# Patient Record
Sex: Male | Born: 1995 | Hispanic: No | Marital: Single | Smoking: Current some day smoker
Health system: Southern US, Community
[De-identification: ages and names within clinical notes are randomized; demographics above are authoritative.]

## PROBLEM LIST (undated history)

## (undated) HISTORY — PX: APPENDECTOMY: SHX54

---
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
## 2014-01-02 ENCOUNTER — Emergency Department (INDEPENDENT_AMBULATORY_CARE_PROVIDER_SITE_OTHER)
Admission: EM | Admit: 2014-01-02 | Discharge: 2014-01-02 | Disposition: A | Discharge status: Home / Self Care | Payer: BC Managed Care – PPO | Source: Home / Self Care

## 2014-01-02 DIAGNOSIS — Z23 Encounter for immunization: Secondary | ICD-10-CM

## 2014-01-02 MED ORDER — TETANUS-DIPHTH-ACELL PERTUSSIS 5-2.5-18.5 LF-MCG/0.5 IM SUSP
0.5000 mL | Freq: Once | INTRAMUSCULAR | Status: AC
  Administered 2014-01-02: 0.5 mL via INTRAMUSCULAR

## 2014-01-02 NOTE — ED Notes (Signed)
Pt is here for TDaP vaccine

## 2016-08-31 ENCOUNTER — Encounter: Payer: Self-pay | Admitting: Emergency Medicine

## 2016-08-31 ENCOUNTER — Emergency Department (INDEPENDENT_AMBULATORY_CARE_PROVIDER_SITE_OTHER)
Admission: EM | Admit: 2016-08-31 | Discharge: 2016-08-31 | Disposition: A | Discharge status: Home / Self Care | Payer: BLUE CROSS/BLUE SHIELD | Source: Home / Self Care | Attending: Family Medicine | Admitting: Family Medicine

## 2016-08-31 DIAGNOSIS — B9789 Other viral agents as the cause of diseases classified elsewhere: Secondary | ICD-10-CM

## 2016-08-31 DIAGNOSIS — J028 Acute pharyngitis due to other specified organisms: Secondary | ICD-10-CM

## 2016-08-31 DIAGNOSIS — L255 Unspecified contact dermatitis due to plants, except food: Secondary | ICD-10-CM

## 2016-08-31 DIAGNOSIS — L237 Allergic contact dermatitis due to plants, except food: Secondary | ICD-10-CM | POA: Diagnosis not present

## 2016-08-31 DIAGNOSIS — J029 Acute pharyngitis, unspecified: Secondary | ICD-10-CM

## 2016-08-31 LAB — POCT RAPID STREP A (OFFICE): RAPID STREP A SCREEN: NEGATIVE

## 2016-08-31 MED ORDER — MUPIROCIN CALCIUM 2 % EX CREA: 1.0000 "application " | TOPICAL_CREAM | Freq: Three times a day (TID) | CUTANEOUS | 0 refills | Status: DC

## 2016-08-31 NOTE — Discharge Instructions (Signed)
Continue to apply Calamine lotion to rash until healed.  If increased redness/pain develop, begin mupirocin antibiotic ointment.  If increased cold symptoms develop, try the following: Take plain guaifenesin (1200mg  extended release tabs such as Mucinex) twice daily, with plenty of water, for cough and congestion.  May add Pseudoephedrine (30mg , one or two every 4 to 6 hours) for sinus congestion.  Get adequate rest.   May use Afrin nasal spray (or generic oxymetazoline) each morning for about 5 days and then discontinue.  Also recommend using saline nasal spray several times daily and saline nasal irrigation (AYR is a common brand).  Use Flonase nasal spray each morning after using Afrin nasal spray and saline nasal irrigation. Try warm salt water gargles for sore throat.  Stop all antihistamines for now, and other non-prescription cough/cold preparations. May take Ibuprofen 200mg , 4 tabs every 8 hours with food for sore throat, body aches, etc.   Follow-up with family doctor if not improving about10 days.

## 2016-08-31 NOTE — ED Triage Notes (Signed)
Patient presents to [facility] with a rash and a complaint of sore throat

## 2016-08-31 NOTE — ED Provider Notes (Signed)
[facility]    CSN: [id number] Arrival date & time: 08/31/16  1422     History   Chief Complaint Chief Complaint  Patient presents with  . Rash  . Sore Throat    HPI Jeremy Fleming is a 21 y.o. male.   Patient contacted poison ivy on his left forearm one week ago, and has a persistent pruritic rash on his left arm.  The rash has improved today after applying calamine lotion. During the past 2 days he has developed a sore throat, sweats, myalgias, and fatigue.  Today he developed a productive cough.  He has seasonal rhinitis (springtime).   The history is provided by the patient.    History reviewed. No pertinent past medical history.  There are no active problems to display for this patient.   Past Surgical History:  Procedure Laterality Date  . APPENDECTOMY         Home Medications    Prior to Admission medications   Medication Sig Start Date End Date Taking? Authorizing Provider  mupirocin cream (BACTROBAN) 2 % Apply 1 application topically 3 (three) times daily. (Rx void after 09/08/16) 08/31/16   Lattie Haw, MD    Family History History reviewed. No pertinent family history.  Social History Social History  Substance Use Topics  . Smoking status: Never Smoker  . Smokeless tobacco: Never Used  . Alcohol use Not on file     Allergies   Hemp seed oil [dronabinol]   Review of Systems Review of Systems  + sore throat + cough No pleuritic pain No wheezing + nasal congestion + post-nasal drainage No sinus pain/pressure No itchy/red eyes No earache No hemoptysis No SOB No fever, + chills/sweats No nausea No vomiting No abdominal pain No diarrhea No urinary symptoms + rash + fatigue + myalgias + headache Used OTC meds without relief    Physical Exam Triage Vital Signs ED Triage Vitals [08/31/16 1513]  Enc Vitals Group     BP (!) 113/55     Pulse Rate 75     Resp      Temp 98.5 F (36.9 C)     Temp Source Oral     SpO2 99 %     Weight 180 lb (81.6 kg)     Height 5\' 7"  (1.702 m)     Head Circumference      Peak Flow      Pain Score 6     Pain Loc      Pain Edu?      Excl. in GC?    No data found.   Updated Vital Signs BP (!) 113/55 (BP Location: Left Arm)   Pulse 75   Temp 98.5 F (36.9 C) (Oral)   Ht 5\' 7"  (1.702 m)   Wt 180 lb (81.6 kg)   SpO2 99%   BMI 28.19 kg/m   Visual Acuity Right Eye Distance:   Left Eye Distance:   Bilateral Distance:    Right Eye Near:   Left Eye Near:    Bilateral Near:     Physical Exam  Constitutional: He appears well-developed and well-nourished. No distress.  HENT:  Head: Normocephalic.  Right Ear: Tympanic membrane, external ear and ear canal normal.  Left Ear: Tympanic membrane, external ear and ear canal normal.  Nose: Nose normal.  Mouth/Throat: Oropharynx is clear and moist.  Eyes: Pupils are equal, round, and reactive to light. Conjunctivae and EOM are normal.  Neck: Neck supple.  Enlarged posterior/lateral  nodes, tender on the left.  Cardiovascular: Normal heart sounds.   Pulmonary/Chest: Breath sounds normal.  Abdominal: There is no tenderness.  Neurological: He is alert.  Skin: Skin is warm and dry. Rash noted. Rash is vesicular.     Lightly erythematous vesicular eruption on left forearm without tenderness, warmth, or swelling.  Nursing note and vitals reviewed.    UC Treatments / Results  Labs (all labs ordered are listed, but only abnormal results are displayed) Labs Reviewed  STREP A DNA PROBE  POCT RAPID STREP A (OFFICE) negative    EKG  EKG Interpretation None       Radiology No results found.  Procedures Procedures (including critical care time)  Medications Ordered in UC Medications - No data to display   Initial Impression / Assessment and Plan / UC Course  I have reviewed the triage vital signs and the nursing notes.  Pertinent labs & imaging results that were available during my care of the  patient were reviewed by me and considered in my medical decision making (see chart for details).    There is no evidence of bacterial infection today.   Continue to apply Calamine lotion to rash until healed.  If increased redness/pain develop, begin mupirocin antibiotic ointment (given Rx to hold).  If increased cold symptoms develop, try the following: Take plain guaifenesin (1200mg  extended release tabs such as Mucinex) twice daily, with plenty of water, for cough and congestion.  May add Pseudoephedrine (30mg , one or two every 4 to 6 hours) for sinus congestion.  Get adequate rest.   May use Afrin nasal spray (or generic oxymetazoline) each morning for about 5 days and then discontinue.  Also recommend using saline nasal spray several times daily and saline nasal irrigation (AYR is a common brand).  Use Flonase nasal spray each morning after using Afrin nasal spray and saline nasal irrigation. Try warm salt water gargles for sore throat.  Stop all antihistamines for now, and other non-prescription cough/cold preparations. May take Ibuprofen 200mg , 4 tabs every 8 hours with food for sore throat, body aches, etc.   Follow-up with family doctor if not improving about10 days.     Final Clinical Impressions(s) / UC Diagnoses   Final diagnoses:  Rhus dermatitis  Acute viral pharyngitis    New Prescriptions New Prescriptions   MUPIROCIN CREAM (BACTROBAN) 2 %    Apply 1 application topically 3 (three) times daily. (Rx void after 09/08/16)         Lattie Haw, MD 08/31/16 [phone]

## 2016-09-05 NOTE — ED Notes (Signed)
NO THROAT CULTURE SENT

## 2017-09-26 ENCOUNTER — Emergency Department
Admission: EM | Admit: 2017-09-26 | Discharge: 2017-09-26 | Disposition: A | Discharge status: Home / Self Care | Payer: BLUE CROSS/BLUE SHIELD | Source: Home / Self Care | Attending: Emergency Medicine | Admitting: Emergency Medicine

## 2017-09-26 ENCOUNTER — Encounter: Payer: Self-pay | Admitting: Emergency Medicine

## 2017-09-26 DIAGNOSIS — R200 Anesthesia of skin: Secondary | ICD-10-CM

## 2017-09-26 NOTE — ED Provider Notes (Signed)
[facility]    CSN: [id number] Arrival date & time: 09/26/17  1020     History   Chief Complaint Chief Complaint  Patient presents with  . Numbness    HPI Jeremy Fleming is a 22 y.o. male.  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 He does have to lift and carry the shingles as well as o gup and down the ladder. He has developed numbness in the medial portion of his left great toe. There is minimal pain but he is concerned about the numbness.He does wear good work shoes. HPI  History reviewed. No pertinent past medical history.  There are no active problems to display for this patient.   Past Surgical History:  Procedure Laterality Date  . APPENDECTOMY         Home Medications    Prior to Admission medications   Not on File    Family History No family history on file.  Social History Social History   Tobacco Use  . Smoking status: Never Smoker  . Smokeless tobacco: Never Used  Substance Use Topics  . Alcohol use: Not on file  . Drug use: Not on file     Allergies   Hemp seed oil [dronabinol]   Review of Systems Review of Systems  Constitutional: Negative.   Musculoskeletal: Negative.   Neurological: Positive for numbness.     Physical Exam Triage Vital Signs ED Triage Vitals  Enc Vitals Group     BP 09/26/17 1045 (!) 145/83     Pulse Rate 09/26/17 1045 67     Resp --      Temp 09/26/17 1045 98.8 F (37.1 C)     Temp Source 09/26/17 1045 Oral     SpO2 09/26/17 1045 100 %     Weight 09/26/17 1046 191 lb 8 oz (86.9 kg)     Height 09/26/17 1046 5\' 7"  (1.702 m)     Head Circumference --      Peak Flow --      Pain Score 09/26/17 1046 0     Pain Loc --      Pain Edu? --      Excl. in GC? --    No data found.  Updated Vital Signs BP (!) 145/83 (BP Location: Right Arm)   Pulse 67   Temp 98.8 F (37.1 C) (Oral)   Ht 5\' 7"  (1.702 m)   Wt 86.9 kg   SpO2 100%   BMI 29.99 kg/m   Visual Acuity Right Eye  Distance:   Left Eye Distance:   Bilateral Distance:    Right Eye Near:   Left Eye Near:    Bilateral Near:     Physical Exam  Musculoskeletal:  Ankle exam is normal. There is decreased sensation mediation of the left great toe. No swelling noted. Normal capillary refill.     UC Treatments / Results  Labs (all labs ordered are listed, but only abnormal results are displayed) Labs Reviewed - No data to display  EKG None  Radiology No results found.  Procedures Procedures (including critical care time)  Medications Ordered in UC Medications - No data to display  Initial Impression / Assessment and Plan / UC Course  I have reviewed the triage vital signs and the nursing notes.  Pertinent labs & imaging results that were available during my care of the patient were reviewed by me and considered in my medical decision making (see chart for details). Symptoms most consistent  with nerve compression to the digital nerve left great toe. Will treat symptomatically and have them follow-up with or consideration for orthotics.He can take over-the-counter anti-inflammatories.   F   Final Clinical Impressions(s) / UC Diagnoses   Final diagnoses:  Numbness  Numbness of toes     Discharge Instructions     Wear shoes with good support. Take Aleve 1-2 twice a day with food. Get gel cushions to insert in your shoes.make an appointment to see Dr. Karie Schwalbe to consider orthotics.    ED Prescriptions    None     Controlled Substance Prescriptions [place] Controlled Substance Registry consulted? Not Applicable   Collene Gobbleaub, Wilberth Damon A, MD 09/26/17 1606

## 2017-09-26 NOTE — Discharge Instructions (Addendum)
Wear shoes with good support. Take Aleve 1-2 twice a day with food. Get gel cushions to insert in your shoes.make an appointment to see Dr. Karie Schwalbe to consider orthotics.

## 2017-09-26 NOTE — ED Triage Notes (Signed)
Patient c/o left big toe numbness for a couple of days, new job requires patient to stand on his feet all day, no injury, no swelling.

## 2019-01-11 ENCOUNTER — Emergency Department (INDEPENDENT_AMBULATORY_CARE_PROVIDER_SITE_OTHER)
Admission: EM | Admit: 2019-01-11 | Discharge: 2019-01-11 | Disposition: A | Discharge status: Home / Self Care | Payer: BC Managed Care – PPO | Source: Home / Self Care | Attending: Family Medicine | Admitting: Family Medicine

## 2019-01-11 ENCOUNTER — Other Ambulatory Visit: Payer: Self-pay

## 2019-01-11 DIAGNOSIS — U071 COVID-19: Secondary | ICD-10-CM

## 2019-01-11 DIAGNOSIS — Z20828 Contact with and (suspected) exposure to other viral communicable diseases: Secondary | ICD-10-CM | POA: Diagnosis not present

## 2019-01-11 DIAGNOSIS — R519 Headache, unspecified: Secondary | ICD-10-CM

## 2019-01-11 DIAGNOSIS — Z20822 Contact with and (suspected) exposure to covid-19: Secondary | ICD-10-CM

## 2019-01-11 LAB — POC SARS CORONAVIRUS 2 AG -  ED: SARS Coronavirus 2 Ag: POSITIVE — AB

## 2019-01-11 NOTE — ED Triage Notes (Signed)
Mother tested positive yesterday.  Pt yesterday was having fatigue, headache.  Today has a sore throat

## 2019-01-11 NOTE — ED Provider Notes (Signed)
[facility]    CSN: [id number] Arrival date & time: 01/11/19  1232      History   Chief Complaint Chief Complaint  Patient presents with  . Sore Throat  . Fatigue  . Headache    HPI Kelli Egolf is a 23 y.o. male.   Patient developed fatigue and headache yesterday, and sore throat and cough today.  His mother tested positive for COVID19 yesterday.  He feels well otherwise.   He denies chest tightness, shortness of breath, and changes in taste/smell.    The history is provided by the patient.    History reviewed. No pertinent past medical history.  There are no problems to display for this patient.   Past Surgical History:  Procedure Laterality Date  . APPENDECTOMY         Home Medications    Prior to Admission medications   Not on File    Family History History reviewed. No pertinent family history.  Social History Social History   Tobacco Use  . Smoking status: Current Some Day Smoker  . Smokeless tobacco: Never Used  Substance Use Topics  . Alcohol use: Yes    Comment: occ  . Drug use: Not Currently     Allergies   Hemp seed oil [dronabinol]   Review of Systems Review of Systems + sore throat + cough No pleuritic pain No wheezing No nasal congestion No post-nasal drainage No sinus pain/pressure No itchy/red eyes No earache No hemoptysis No SOB No fever/chills No nausea No vomiting No abdominal pain No diarrhea No urinary symptoms No skin rash + fatigue No myalgias + headache   Physical Exam Triage Vital Signs ED Triage Vitals  Enc Vitals Group     BP 01/11/19 1249 (!) 143/91     Pulse Rate 01/11/19 1249 (!) 107     Resp 01/11/19 1249 20     Temp 01/11/19 1249 98.6 F (37 C)     Temp Source 01/11/19 1249 Oral     SpO2 01/11/19 1249 97 %     Weight 01/11/19 1250 185 lb (83.9 kg)     Height 01/11/19 1250 5\' 7"  (1.702 m)     Head Circumference --      Peak Flow --      Pain Score 01/11/19 1249 3   Pain Loc --      Pain Edu? --      Excl. in [place]? --    No data found.  Updated Vital Signs BP (!) 143/91 (BP Location: Right Arm)   Pulse (!) 107   Temp 98.6 F (37 C) (Oral)   Resp 20   Ht 5\' 7"  (1.702 m)   Wt 83.9 kg   SpO2 97%   BMI 28.98 kg/m   Visual Acuity Right Eye Distance:   Left Eye Distance:   Bilateral Distance:    Right Eye Near:   Left Eye Near:    Bilateral Near:     Physical Exam Nursing notes and Vital Signs reviewed. Appearance:  Patient appears stated age, and in no acute distress Eyes:  Pupils are equal, round, and reactive to light and accomodation.  Extraocular movement is intact.  Conjunctivae are not inflamed  Ears:  Canals normal.  Tympanic membranes normal.  Nose:  Mildly congested turbinates.  No sinus tenderness.   Pharynx:  Normal Neck:  Supple.  No adenopathy Lungs:  Clear to auscultation.  Breath sounds are equal.  Moving air well. Heart:  Regular rate and  rhythm without murmurs, rubs, or gallops.  Abdomen:  Nontender without masses or hepatosplenomegaly.  Bowel sounds are present.  No CVA or flank tenderness.  Extremities:  No edema.  Skin:  No rash present.   UC Treatments / Results  Labs (all labs ordered are listed, but only abnormal results are displayed) Labs Reviewed  POC SARS CORONAVIRUS 2 AG -  ED - Abnormal; Notable for the following components:      Result Value   SARS Coronavirus 2 Ag Positive (*)    All other components within normal limits    EKG   Radiology No results found.  Procedures Procedures (including critical care time)  Medications Ordered in UC Medications - No data to display  Initial Impression / Assessment and Plan / UC Course  I have reviewed the triage vital signs and the nursing notes.  Pertinent labs & imaging results that were available during my care of the patient were reviewed by me and considered in my medical decision making (see chart for details).    Benign exam.  Treat  symptomatically for now.   Final Clinical Impressions(s) / UC Diagnoses   Final diagnoses:  Close exposure to COVID-19 virus  Bad headache  COVID-19 virus infection     Discharge Instructions     Take plain guaifenesin (1200mg  extended release tabs such as Mucinex) twice daily, with plenty of water, for cough and congestion.  May add Pseudoephedrine (30mg , one or two every 4 to 6 hours) for sinus congestion.  Get adequate rest.   May use Afrin nasal spray (or generic oxymetazoline) each morning for about 5 days and then discontinue.  Also recommend using saline nasal spray several times daily and saline nasal irrigation (AYR is a common brand).  Use Flonase nasal spray each morning after using Afrin nasal spray and saline nasal irrigation. Try warm salt water gargles for sore throat.  Stop all antihistamines for now, and other non-prescription cough/cold preparations. May take Ibuprofen 200mg , 4 tabs every 8 hours with food for body aches, headache, etc. May take Delsym Cough Suppressant at bedtime for nighttime cough.   Increase Vitamin D3 to 5000 units daily, and Vitamin C 500mg  twice daily.   Your COVID19 test is positive.  You are infected with the novel coronavirus and could give the virus to others.  Please continue isolation at home for at least 10 days since the start of your symptoms.  Once you complete your 10 day quarantine, you may return to normal activities as long as you've not had a fever for over 24 hours (without taking fever reducing medicine) and your symptoms are improving. Please continue good preventive care measures, including:  frequent hand-washing, avoid touching your face, cover coughs/sneezes, stay out of crowds and keep a 6 foot distance from others.  Go to the nearest hospital emergency room if fever/cough/breathlessness are severe or illness seems like a threat to life.     ED Prescriptions    None        , MD 01/14/19 [phone]

## 2019-01-11 NOTE — Discharge Instructions (Addendum)
Take plain guaifenesin (1200mg  extended release tabs such as Mucinex) twice daily, with plenty of water, for cough and congestion.  May add Pseudoephedrine (30mg , one or two every 4 to 6 hours) for sinus congestion.  Get adequate rest.   May use Afrin nasal spray (or generic oxymetazoline) each morning for about 5 days and then discontinue.  Also recommend using saline nasal spray several times daily and saline nasal irrigation (AYR is a common brand).  Use Flonase nasal spray each morning after using Afrin nasal spray and saline nasal irrigation. Try warm salt water gargles for sore throat.  Stop all antihistamines for now, and other non-prescription cough/cold preparations. May take Ibuprofen 200mg , 4 tabs every 8 hours with food for body aches, headache, etc. May take Delsym Cough Suppressant at bedtime for nighttime cough.   Increase Vitamin D3 to 5000 units daily, and Vitamin C 500mg  twice daily.   Your COVID19 test is positive.  You are infected with the novel coronavirus and could give the virus to others.  Please continue isolation at home for at least 10 days since the start of your symptoms.  Once you complete your 10 day quarantine, you may return to normal activities as long as you've not had a fever for over 24 hours (without taking fever reducing medicine) and your symptoms are improving. Please continue good preventive care measures, including:  frequent hand-washing, avoid touching your face, cover coughs/sneezes, stay out of crowds and keep a 6 foot distance from others.  Go to the nearest hospital emergency room if fever/cough/breathlessness are severe or illness seems like a threat to life.

## 2020-04-17 ENCOUNTER — Encounter: Payer: Self-pay | Admitting: Sports Medicine

## 2020-04-17 ENCOUNTER — Other Ambulatory Visit: Payer: Self-pay

## 2020-04-17 ENCOUNTER — Ambulatory Visit (INDEPENDENT_AMBULATORY_CARE_PROVIDER_SITE_OTHER): Payer: BLUE CROSS/BLUE SHIELD | Admitting: Sports Medicine

## 2020-04-17 VITALS — BP 118/79 | HR 72 | Ht 67.0 in | Wt 212.0 lb

## 2020-04-17 DIAGNOSIS — R569 Unspecified convulsions: Secondary | ICD-10-CM | POA: Diagnosis not present

## 2020-04-17 DIAGNOSIS — G40909 Epilepsy, unspecified, not intractable, without status epilepticus: Secondary | ICD-10-CM | POA: Insufficient documentation

## 2020-04-17 DIAGNOSIS — R011 Cardiac murmur, unspecified: Secondary | ICD-10-CM | POA: Diagnosis not present

## 2020-04-17 NOTE — Progress Notes (Signed)
    Procedures performed today:    None.  Independent interpretation of notes and tests performed by another provider:   None.  Brief History, Exam, Impression, and Recommendations:    Nocturnal seizure (HCC) Jeremy Fleming is a pleasant and previously healthy 25 year old male, on 2 occasions in March and early April he woke up after having bitten his tongue, he also had urinary incontinence. He never had any symptoms prior, no constitutional symptoms, no head trauma, no prior infections. 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 He simply woke up having wet the bed and bit his tongue. Certainly the concern is for seizure disorder. Nondilated funduscopy is normal today, his neurologic exam is also normal. He does have a 2/6 systolic murmur, adding an echocardiogram. We are going to get him referred to neurology, he likely needs EEGs particularly considering nocturnal seizure disorder, he was cautioned against driving and swimming, adding a brain MRI as well as some labs including urine drug screen.   Systolic murmur Systolic murmur, potential new onset seizures, adding an echocardiogram.    ___________________________________________ Ihor Austin. Benjamin Stain, M.D., ABFM., CAQSM. Primary Care and Sports Medicine [facility]  Adjunct Instructor of Family Medicine  [employer]

## 2020-04-17 NOTE — Assessment & Plan Note (Signed)
Systolic murmur, potential new onset seizures, adding an echocardiogram.

## 2020-04-17 NOTE — Assessment & Plan Note (Signed)
Jeremy Fleming is a pleasant and previously healthy 25 year old male, on 2 occasions in March and early April he woke up after having bitten his tongue, he also had urinary incontinence. He never had any symptoms prior, no constitutional symptoms, no head trauma, no prior infections. 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 He simply woke up having wet the bed and bit his tongue. Certainly the concern is for seizure disorder. Nondilated funduscopy is normal today, his neurologic exam is also normal. He does have a 2/6 systolic murmur, adding an echocardiogram. We are going to get him referred to neurology, he likely needs EEGs particularly considering nocturnal seizure disorder, he was cautioned against driving and swimming, adding a brain MRI as well as some labs including urine drug screen.

## 2020-04-18 ENCOUNTER — Encounter: Payer: Self-pay | Admitting: Sports Medicine

## 2020-04-18 LAB — COMPREHENSIVE METABOLIC PANEL: Glucose, Bld: 80 mg/dL (ref 65–99)

## 2020-04-20 ENCOUNTER — Telehealth: Payer: Self-pay

## 2020-04-20 NOTE — Telephone Encounter (Signed)
Patient is scheduled for downstairs on 05/07/20.  They tried to schedule him earlier but he was traveling per Mom. Advised Mom to get a disc with the images when they go for the MRI since they want to see a neurologist in [place].

## 2020-04-20 NOTE — Telephone Encounter (Signed)
Mom, Clydie Braun, left a message that [facility] couldn't get them in before 05/17/20 for the MRI. They are concerned about waiting this long. I think they are trying to stay in [facility] for all services and within their insurance network [facility]) which is limiting their options. Please advise.

## 2020-04-20 NOTE — Telephone Encounter (Signed)
I absolutely will let you know.

## 2020-04-20 NOTE — Telephone Encounter (Signed)
I would agree that having to wait a month for this is unreasonable.  The other option is they call around to facilities that are in network and see who has MRI availabilities.  Final option, they may just have to do it here and pay what ever out of network fees are needed, we have openings Monday.

## 2020-04-20 NOTE — Telephone Encounter (Signed)
Please let me know if they change locations because I will have to contact his ins to change that for the authorization.

## 2020-04-22 LAB — COMPREHENSIVE METABOLIC PANEL
AG Ratio: 2.3 (calc) (ref 1.0–2.5)
Total Protein: 7.2 g/dL (ref 6.1–8.1)

## 2020-04-22 NOTE — Telephone Encounter (Signed)
Thank you, PS note location switch for MRI

## 2020-04-24 ENCOUNTER — Telehealth: Payer: Self-pay | Admitting: Sports Medicine

## 2020-04-24 NOTE — Telephone Encounter (Signed)
MRI brain approved for continuity of care and emergency care, authorization #[id number] good 04/23/2020 through 10/19/2020.

## 2020-04-26 LAB — VITAMIN B6: Vitamin B6: 36.3 ng/mL — ABNORMAL HIGH (ref 2.1–21.7)

## 2020-04-26 LAB — COMPREHENSIVE METABOLIC PANEL WITH GFR
AST: 20 U/L (ref 10–40)
Alkaline phosphatase (APISO): 42 U/L (ref 36–130)
BUN: 12 mg/dL (ref 7–25)
CO2: 27 mmol/L (ref 20–32)
Chloride: 100 mmol/L (ref 98–110)
Sodium: 138 mmol/L (ref 135–146)

## 2020-04-26 LAB — LIPID PANEL
Cholesterol: 225 mg/dL — ABNORMAL HIGH (ref <200)
HDL: 45 mg/dL (ref >=40)
LDL Cholesterol (Calc): 135 mg/dL — ABNORMAL HIGH
Non-HDL Cholesterol (Calc): 180 mg/dL (calc) — ABNORMAL HIGH (ref <130)
Total CHOL/HDL Ratio: 5 (calc) — ABNORMAL HIGH (ref <5.0)
Triglycerides: 292 mg/dL — ABNORMAL HIGH (ref <150)

## 2020-04-26 LAB — COMPREHENSIVE METABOLIC PANEL
ALT: 34 U/L (ref 9–46)
Albumin: 5 g/dL (ref 3.6–5.1)
Calcium: 9.9 mg/dL (ref 8.6–10.3)
Creat: 0.77 mg/dL (ref 0.60–1.35)
Globulin: 2.2 g/dL (calc) (ref 1.9–3.7)
Potassium: 3.8 mmol/L (ref 3.5–5.3)
Total Bilirubin: 1.4 mg/dL — ABNORMAL HIGH (ref 0.2–1.2)

## 2020-04-26 LAB — CBC
HCT: 45.6 % (ref 38.5–50.0)
Hemoglobin: 15.6 g/dL (ref 13.2–17.1)
MCH: 30.4 pg (ref 27.0–33.0)
MCHC: 34.2 g/dL (ref 32.0–36.0)
MCV: 88.7 fL (ref 80.0–100.0)
MPV: 9.5 fL (ref 7.5–12.5)
Platelets: 300 Thousand/uL (ref 140–400)
RBC: 5.14 Million/uL (ref 4.20–5.80)
RDW: 11.8 % (ref 11.0–15.0)
WBC: 8.8 Thousand/uL (ref 3.8–10.8)

## 2020-04-26 LAB — DRUG SCREEN, COMPREHENSIVE, WITH CONFIRMATION, URINE: Urine Results: DETECTED — AB

## 2020-04-26 LAB — PROLACTIN: Prolactin: 3.5 ng/mL (ref 2.0–18.0)

## 2020-04-26 LAB — HEMOGLOBIN A1C
Hgb A1c MFr Bld: 4.7 %{Hb} (ref <5.7)
Mean Plasma Glucose: 88 mg/dL
eAG (mmol/L): 4.9 mmol/L

## 2020-04-26 LAB — TSH: TSH: 2.42 m[IU]/L (ref 0.40–4.50)

## 2020-04-30 ENCOUNTER — Other Ambulatory Visit: Payer: BLUE CROSS/BLUE SHIELD

## 2020-05-02 ENCOUNTER — Ambulatory Visit (INDEPENDENT_AMBULATORY_CARE_PROVIDER_SITE_OTHER): Payer: BLUE CROSS/BLUE SHIELD

## 2020-05-02 ENCOUNTER — Other Ambulatory Visit: Payer: Self-pay

## 2020-05-02 ENCOUNTER — Ambulatory Visit: Payer: BLUE CROSS/BLUE SHIELD | Admitting: Sports Medicine

## 2020-05-02 DIAGNOSIS — G40909 Epilepsy, unspecified, not intractable, without status epilepticus: Secondary | ICD-10-CM | POA: Diagnosis not present

## 2020-05-02 DIAGNOSIS — G9389 Other specified disorders of brain: Secondary | ICD-10-CM | POA: Diagnosis not present

## 2020-05-02 MED ORDER — IOHEXOL 300 MG/ML  SOLN
100.0000 mL | Freq: Once | INTRAMUSCULAR | Status: AC | PRN
  Administered 2020-05-02: 75 mL via INTRAVENOUS

## 2020-05-02 MED ORDER — LEVETIRACETAM 500 MG PO TABS: ORAL_TABLET | ORAL | 3 refills | Status: AC

## 2020-05-02 NOTE — Progress Notes (Addendum)
    Procedures performed today:    None.  Independent interpretation of notes and tests performed by another provider:   None.  Brief History, Exam, Impression, and Recommendations:    Seizure disorder (HCC) Jeremy Fleming is a pleasant, previously healthy 25 year old male, he has now had 3 generalized tonic clonic seizures. He has bitten his tongue both times, he was postictal afterwards. The most recent episode was on an airplane. Neurologic exam is normal today, he does have signs of tongue biting. He does have a brain MRI scheduled in 5 days, due to the new seizure and headache we are going to get a head CT with IV contrast today. I did discuss the case as a curbside with one of our neuro hospitalists who agrees with the above plan. As this is his third seizure and he has not yet seen the neurologist I am also going to start levetiracetam loaded 1000 mg p.o. daily x1 and then 500 mg p.o. twice daily. He understands that he will not be able to drive potentially for at least 6 months, and should avoid swimming, and working at heights.   Brain mass CT scan personally reviewed. 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    ___________________________________________ Ihor Austin. Benjamin Stain, M.D., ABFM., CAQSM. Primary Care and Sports Medicine [facility]  Adjunct Instructor of Family Medicine  [employer]

## 2020-05-02 NOTE — Assessment & Plan Note (Addendum)
CT scan personally reviewed. 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

## 2020-05-02 NOTE — Assessment & Plan Note (Addendum)
Jeremy Fleming is a pleasant, previously healthy 25 year old male, he has now had 3 generalized tonic clonic seizures. He has bitten his tongue both times, he was postictal afterwards. The most recent episode was on an airplane. Neurologic exam is normal today, he does have signs of tongue biting. He does have a brain MRI scheduled in 5 days, due to the new seizure and headache we are going to get a head CT with IV contrast today. I did discuss the case as a curbside with one of our neuro hospitalists who agrees with the above plan. As this is his third seizure and he has not yet seen the neurologist I am also going to start levetiracetam loaded 1000 mg p.o. daily x1 and then 500 mg p.o. twice daily. He understands that he will not be able to drive potentially for at least 6 months, and should avoid swimming, and working at heights.

## 2020-05-02 NOTE — Addendum Note (Signed)
Addended by: Monica Becton on: 05/02/2020 05:35 PM   Modules accepted: Orders

## 2020-05-07 ENCOUNTER — Other Ambulatory Visit: Payer: Self-pay

## 2020-05-07 ENCOUNTER — Telehealth (INDEPENDENT_AMBULATORY_CARE_PROVIDER_SITE_OTHER): Payer: BLUE CROSS/BLUE SHIELD | Admitting: Family Medicine

## 2020-05-07 ENCOUNTER — Ambulatory Visit (INDEPENDENT_AMBULATORY_CARE_PROVIDER_SITE_OTHER): Payer: BLUE CROSS/BLUE SHIELD

## 2020-05-07 ENCOUNTER — Encounter: Payer: Self-pay | Admitting: Family Medicine

## 2020-05-07 VITALS — Temp 99.0°F | Wt 210.0 lb

## 2020-05-07 DIAGNOSIS — R509 Fever, unspecified: Secondary | ICD-10-CM

## 2020-05-07 DIAGNOSIS — R9389 Abnormal findings on diagnostic imaging of other specified body structures: Secondary | ICD-10-CM

## 2020-05-07 DIAGNOSIS — R569 Unspecified convulsions: Secondary | ICD-10-CM | POA: Diagnosis not present

## 2020-05-07 MED ORDER — GADOBUTROL 1 MMOL/ML IV SOLN
9.0000 mL | Freq: Once | INTRAVENOUS | Status: AC | PRN
  Administered 2020-05-07: 9 mL via INTRAVENOUS

## 2020-05-07 NOTE — Addendum Note (Signed)
Addended by: Monica Becton on: 05/07/2020 05:46 PM   Modules accepted: Orders

## 2020-05-07 NOTE — Progress Notes (Signed)
Virtual Visit via Telephone Note  I connected with  Jeremy Fleming on 05/07/20 at  2:00 PM EDT by telephone and verified that I am speaking with the correct person using two identifiers.   I discussed the limitations, risks, security and privacy concerns of performing an evaluation and management service by telephone and the availability of in person appointments. I also discussed with the patient that there may be a patient responsible charge related to this service. The patient expressed understanding and agreed to proceed.  Participating parties included in this telephone visit include: The patient and the nurse practitioner listed.  The patient is: At home I am: In the office  Subjective:    CC: fever  HPI: Jeremy Fleming is a 25 y.o. year old male presenting today via telephone visit to discuss fever.  Patient reports last night he had a fever (101 F) with chills, body aches, and intermittent 5/10 headache with mild sore throat and occasional coughing overnight. He was not able to sleep well because of these symptoms, and reports when he did finally fall asleep and wake up this morning he was sweaty as if his fever had broken. He states he has been taking occasional tylenol since symptoms began, but has been feeling mostly normal all day. States he is a little tired, but reports he has been more fatigued since starting keppra last Wednesday.   Has had recent seizures prompting a CT scan which showed a frontal mass and led to an MRI which was done earlier today, but results are still pending. States his last seizure (last Tuesday) he bit his tongue and cheek pretty bad, but they seem to be healing and do not appear infected per patient.   He has not had any chest pain, trouble breathing, ear pain, sinus pain/pressure, dysuria, hematuria, abdominal pain, blood in stool, nausea, vomiting, diarrhea, neck pain/stiffness, vision changes.  Co-worker with brief virus, but was resolved by the time he  saw him - not tested for COVID. Patient is not vaccinated against COVID but he took a home COVID test today which was negative.     Past medical history, Surgical history, Family history not pertinant except as noted below, Social history, Allergies, and medications have been entered into the medical record, reviewed, and corrections made.   Review of Systems:  All review of systems negative except what is listed in the HPI  Objective:    General:  Patient speaking clearly in complete sentences. No shortness of breath noted.   Alert and oriented x3.   Normal judgment.  No apparent acute distress.  Impression and Recommendations:    1. Fever, unspecified fever cause Patient with fever and symptoms as described above last night but now completely resolved. Home COVID test was negative. Given that his symptoms have fully resolved, encouraged close monitoring of return of symptoms. Patient educated on signs and symptoms that would require further evaluation.   Follow-up if symptoms worsen or fail to improve.   I discussed the assessment and treatment plan with the patient. The patient was provided an opportunity to ask questions and all were answered. The patient agreed with the plan and demonstrated an understanding of the instructions.   The patient was advised to call back or seek an in-person evaluation if the symptoms worsen or if the condition fails to improve as anticipated.  I provided 20 minutes of non-face-to-face time during this TELEPHONE encounter.    Clayborne Dana, NP

## 2020-05-08 ENCOUNTER — Ambulatory Visit: Payer: BLUE CROSS/BLUE SHIELD | Admitting: Diagnostic Neuroimaging

## 2020-05-25 ENCOUNTER — Ambulatory Visit (HOSPITAL_COMMUNITY): Payer: BLUE CROSS/BLUE SHIELD

## 2020-05-30 ENCOUNTER — Telehealth: Payer: Self-pay | Admitting: General Practice

## 2020-05-30 NOTE — Telephone Encounter (Signed)
Transition Care Management Follow-up Telephone Call  Date of discharge and from where: [facility] 05/28/20  How have you been since you were released from the hospital? Patient had surgery and is doing better.  Any questions or concerns? No  Items Reviewed:  Did the pt receive and understand the discharge instructions provided? Yes   Medications obtained and verified? Yes   Other? No   Any new allergies since your discharge? No   Dietary orders reviewed? Yes  Do you have support at home? Yes   Home Care and Equipment/Supplies: Were home health services ordered? no  Functional Questionnaire: (I = Independent and D = Dependent) ADLs: I  Bathing/Dressing- I  Meal Prep- I  Eating- I  Maintaining continence- I  Transferring/Ambulation- I  Managing Meds- I  Follow up appointments reviewed:   PCP Hospital f/u appt confirmed? No  Patient referred to specialist at this time.  Specialist Hospital f/u appt confirmed? Yes  Scheduled to see Dr. Tempie Donning on 07/04/20 @ 1230.  Are transportation arrangements needed? No   If their condition worsens, is the pt aware to call PCP or go to the Emergency Dept.? Yes  Was the patient provided with contact information for the PCP's office or ED? Yes  Was to pt encouraged to call back with questions or concerns? Yes

## 2022-05-08 IMAGING — MR MR HEAD WO/W CM
15 series · 41 of 48 positions shown · IV contrast (gadavist)
Comparison: Correlation made with prior CT

CLINICAL DATA: Potential nocturnal seizures, abnormal CT

EXAM:
MRI HEAD WITHOUT AND WITH CONTRAST
TECHNIQUE: Multiplanar, multiecho pulse sequences of the brain and surrounding
structures were obtained without and with intravenous contrast.
CONTRAST:  9mL GADAVIST GADOBUTROL 1 MMOL/ML IV SOLN

[Series 2: DWI · axial · 3.0mm · 1.20mm/px · z∈[-65,+97]mm · 6 of 110 slices shown (1 of 4)]
[im 1/110]
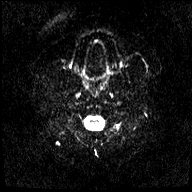
[im 22/110]
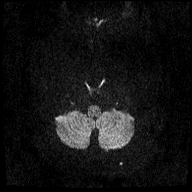
[im 44/110]
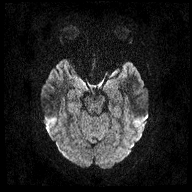
[im 66/110]
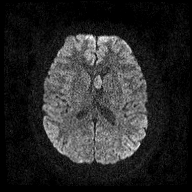
[im 88/110]
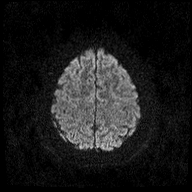
[im 110/110]
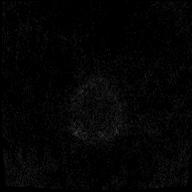

[Series 3: DWI · axial · 3.0mm · 1.20mm/px · z∈[-65,+97]mm · 3 of 55 slices shown (2 of 4)]
[im 1/55]
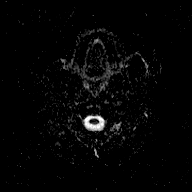
[im 28/55]
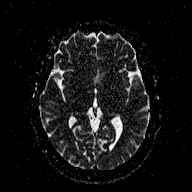
[im 55/55]
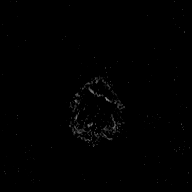

[Series 4: DWI · coronal · 3.0mm · 1.15mm/px · 5 of 89 slices shown (3 of 4)]
[im 1/89]
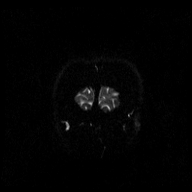
[im 23/89]
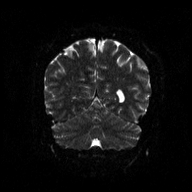
[im 45/89]
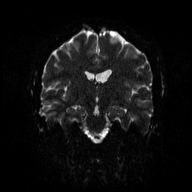
[im 67/89]
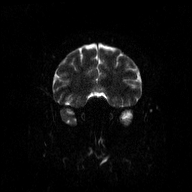
[im 89/89]
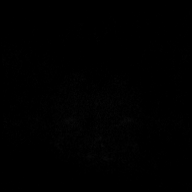

[Series 5: DWI · coronal · 3.0mm · 1.15mm/px · 2 of 45 slices shown (4 of 4)]
[im 1/45]
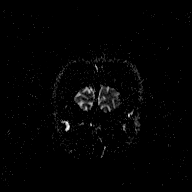
[im 45/45]
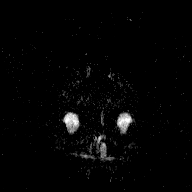

[Series 6: T1 · sagittal · 5.0mm · 0.45mm/px · 1 of 23 slices shown (1 of 2)]
[im 1/23]
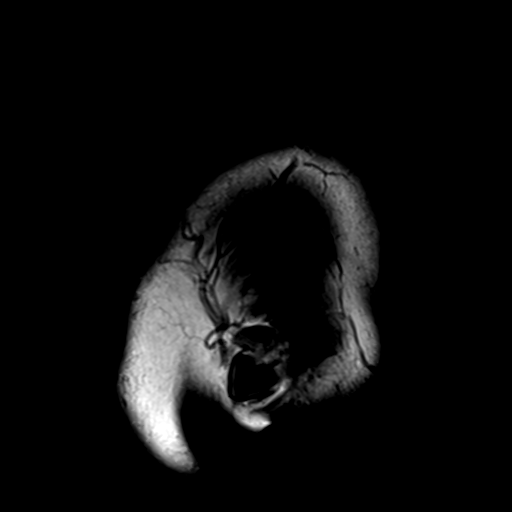

[Series 7: T2 · axial · 5.0mm · 0.72mm/px · 1 of 23 slices shown (1 of 3)]
[im 1/23]
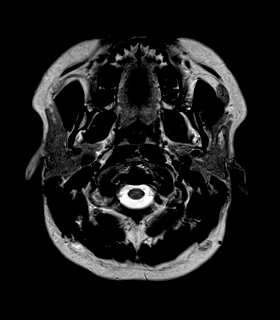

[Series 8: FLAIR · axial · 3.0mm · 0.45mm/px · z∈[-61,+101]mm · 3 of 55 slices shown (1 of 2)]
[im 1/55]
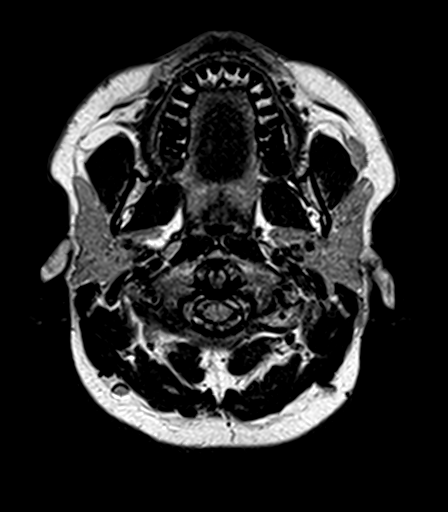
[im 28/55]
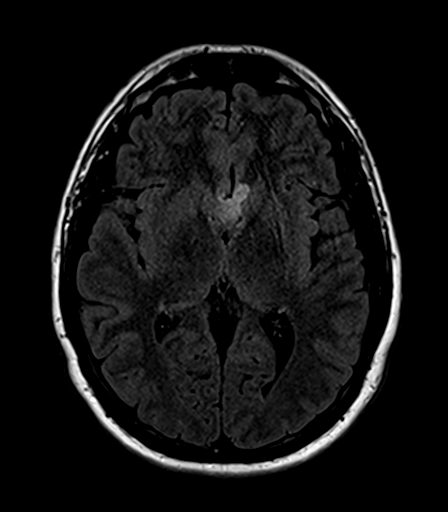
[im 55/55]
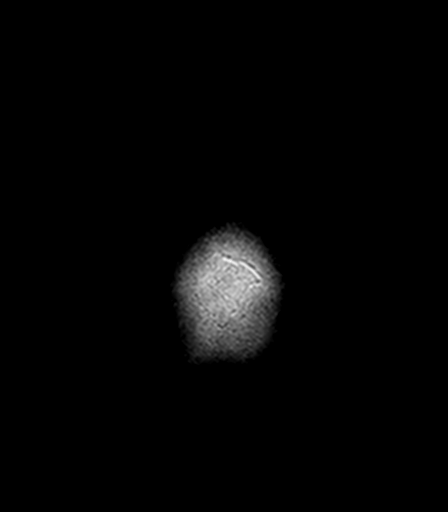

[Series 9: T2 · axial · 5.0mm · 0.72mm/px · 1 of 23 slices shown (2 of 3)]
[im 1/23]
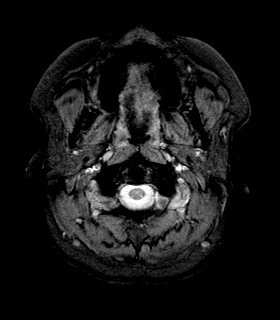

[Series 10: T1 · axial · 1.0mm · 1.00mm/px · 1 of 160 slices shown (2 of 2)]
[im 1/160]
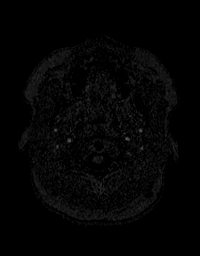

[Series 11: FLAIR · coronal · 3.0mm · 0.45mm/px · 3 of 61 slices shown (2 of 2)]
[im 1/61]
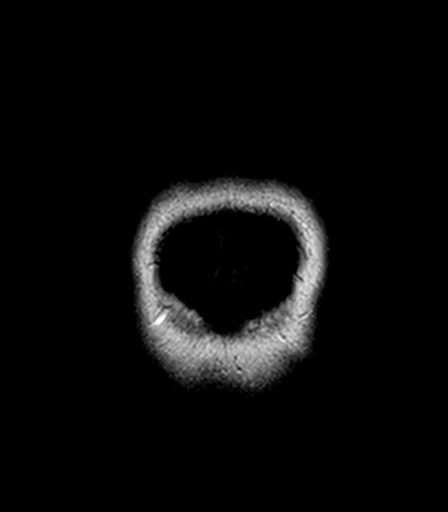
[im 31/61]
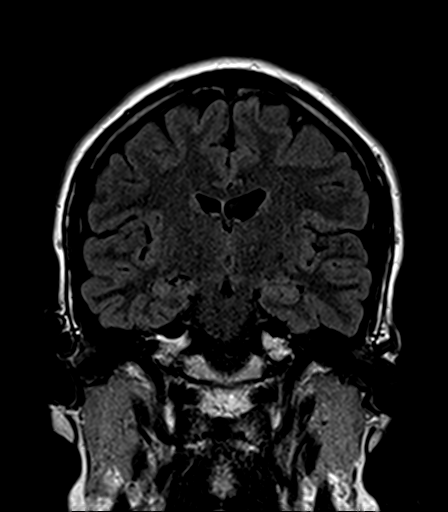
[im 61/61]
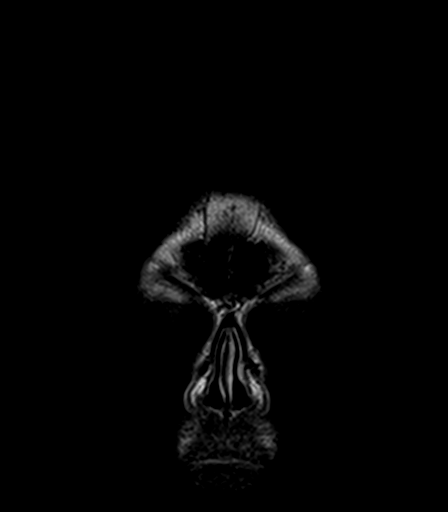

[Series 12: T2 · coronal · 5.0mm · 0.43mm/px · 2 of 29 slices shown (3 of 3)]
[im 1/29]
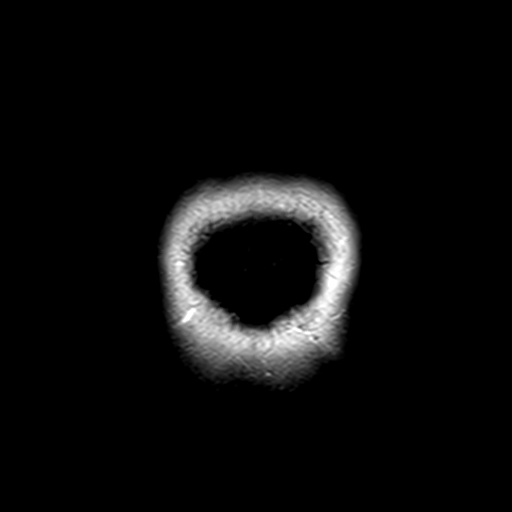
[im 29/29]
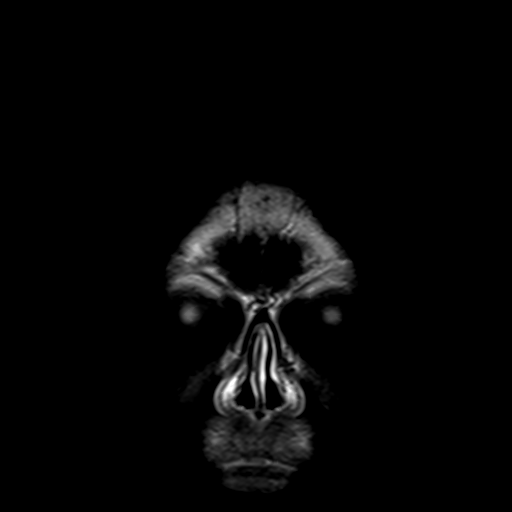

[Series 13: T2 post-contrast · coronal · 5.0mm · 0.43mm/px · 2 of 30 slices shown]
[im 1/30]
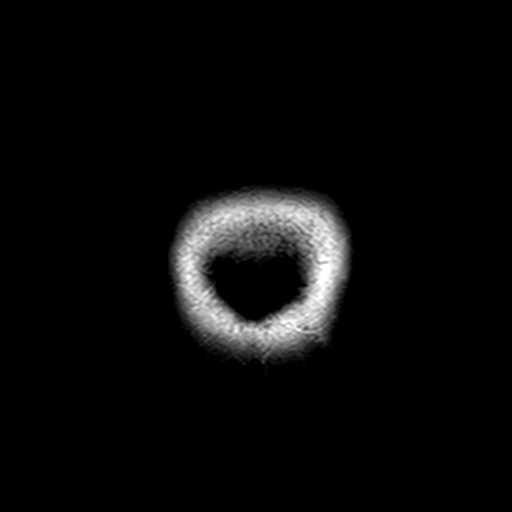
[im 30/30]
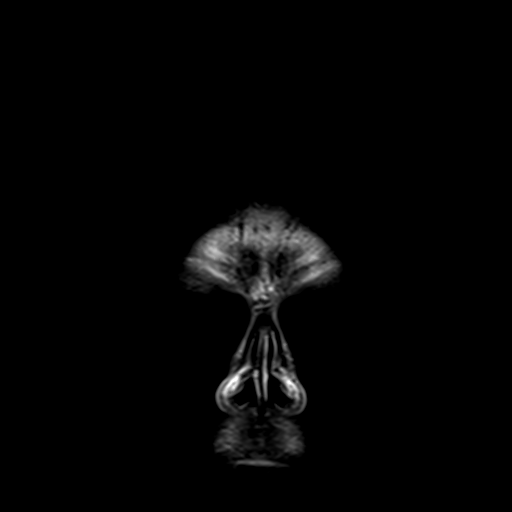

[Series 14: T1 post-contrast · axial · 1.0mm · 1.00mm/px · z∈[-56,+103]mm · 8 of 160 slices shown (1 of 3)]
[im 1/160]
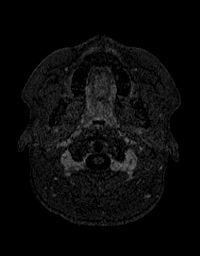
[im 23/160]
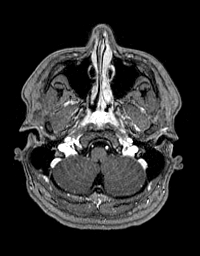
[im 46/160]
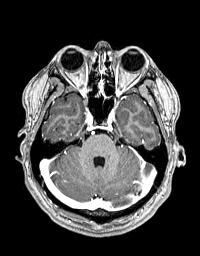
[im 69/160]
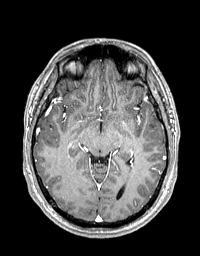
[im 91/160]
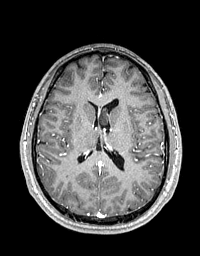
[im 114/160]
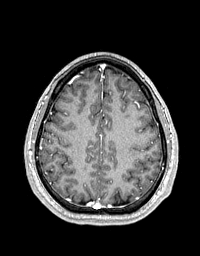
[im 137/160]
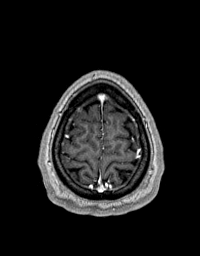
[im 160/160]
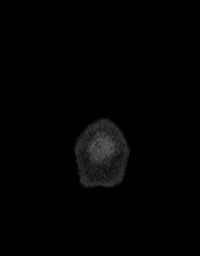

[Series 15: T1 post-contrast · coronal · 5.0mm · 0.43mm/px · 2 of 31 slices shown (2 of 3)]
[im 1/31]
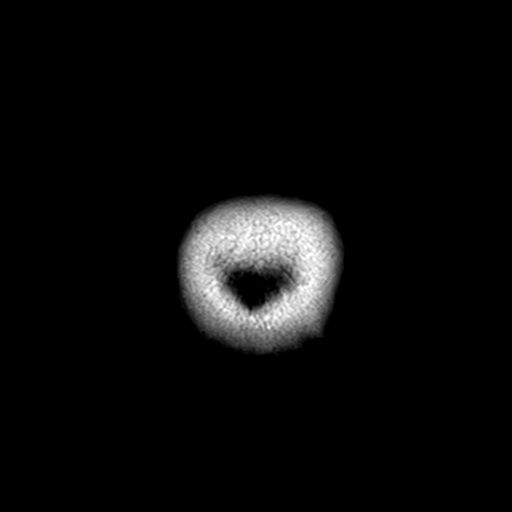
[im 31/31]
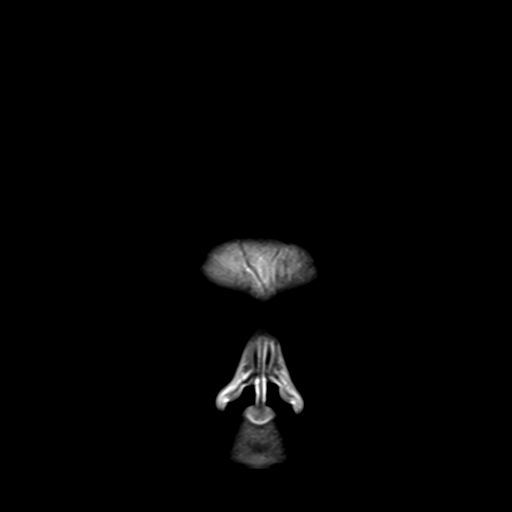

[Series 16: T1 post-contrast · sagittal · 5.0mm · 0.45mm/px · 1 of 23 slices shown (3 of 3)]
[im 1/23]
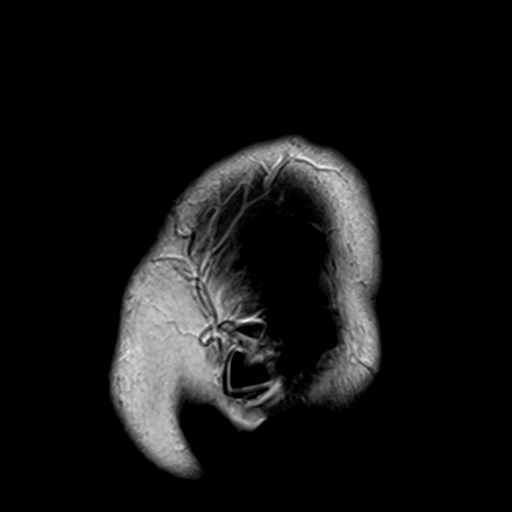

[41 of 48 positions shown; findings below may reference images not displayed]

FINDINGS: Brain: Corresponding to the abnormality on the CT, there is a mildly
heterogeneous T2 hyperintense lesion within the left frontal horn
abutting the septum pellucidum. Adjacent ill-defined T2
hyperintensity extending into the paramedian left frontal parenchyma
along the inferior caudate head and inferiorly toward the optic
chiasm along the anterior margin of the third ventriclea. Minimal
contralateral extension to the right is present along the inferior
genu of the corpus callosum (series 11). Punctate focus of
enhancement is present on series 14, image 81 but could also be
vascular. Approximate measurement of 2 x 1.4 x 2.7 cm.

No evidence of acute infarction or hemorrhage. Hippocampi are
symmetric and unremarkable. No hydrocephalus.

Vascular: Major vessel flow voids at the skull base are preserved.

Skull and upper cervical spine: Normal marrow signal is preserved.

Sinuses/Orbits: Paranasal sinuses are aerated. Orbits are
unremarkable.

Other: Sella is unremarkable.  Mastoid air cells are clear.
IMPRESSION: Mass within the frontal horn of the left lateral ventricle along the
septum pellucidum with abnormal signal in the adjacent brain
parenchyma. Questionable trace enhancement. Favored to reflect
infiltrating low-grade glioma extending into the ventricle.
Appearance or location not typical for other lesions in the
differential such as central neurocytoma, choroid glioma, or
subependymoma.

No hydrocephalus.

## 2023-04-10 ENCOUNTER — Ambulatory Visit (INDEPENDENT_AMBULATORY_CARE_PROVIDER_SITE_OTHER): Payer: Self-pay | Admitting: Sports Medicine

## 2023-04-10 ENCOUNTER — Encounter: Payer: Self-pay | Admitting: Sports Medicine

## 2023-04-10 VITALS — BP 132/94 | HR 86 | Temp 98.0°F | Resp 20 | Ht 67.0 in | Wt 229.0 lb

## 2023-04-10 DIAGNOSIS — J019 Acute sinusitis, unspecified: Secondary | ICD-10-CM | POA: Diagnosis not present

## 2023-04-10 DIAGNOSIS — R6889 Other general symptoms and signs: Secondary | ICD-10-CM

## 2023-04-10 LAB — POC COVID19 BINAXNOW: SARS Coronavirus 2 Ag: NEGATIVE

## 2023-04-10 LAB — POCT INFLUENZA A/B
Influenza A, POC: NEGATIVE
Influenza B, POC: NEGATIVE

## 2023-04-10 MED ORDER — AMOXICILLIN-POT CLAVULANATE 875-125 MG PO TABS: 1.0000 | ORAL_TABLET | Freq: Two times a day (BID) | ORAL | 0 refills | Status: AC

## 2023-04-10 NOTE — Progress Notes (Signed)
    Procedures performed today:    None.  Independent interpretation of notes and tests performed by another provider:   None.  Brief History, Exam, Impression, and Recommendations:    Acute sinusitis This is a very pleasant 28 year old male, for the past 2 days he has had chills, achiness, runny nose, cough. Headache. Subjective fevers. On exam today he appears tired but well. Oropharyngeal exam, nasopharynx all normal. Lungs are clear no rashes. COVID and flu testing is negative today. Suspect an acute frontal sinusitis. Adding Augmentin, return as needed.  I spent 30 minutes of total time managing this patient today, this includes chart review, face to face, and non-face to face time.  ____________________________________________ Ihor Austin. Benjamin Stain, M.D., ABFM., CAQSM., AME. Primary Care and Sports Medicine [facility]  Adjunct Professor of Family Medicine  [employer]  Restaurant manager, fast food

## 2023-04-10 NOTE — Assessment & Plan Note (Addendum)
 This is a very pleasant 28 year old male, for the past 2 days he has had chills, achiness, runny nose, cough. Headache. Subjective fevers. On exam today he appears tired but well. Oropharyngeal exam, nasopharynx all normal. Lungs are clear no rashes. COVID and flu testing is negative today. Suspect an acute frontal sinusitis. Adding Augmentin, return as needed.

## 2023-09-15 ENCOUNTER — Encounter: Payer: Self-pay | Admitting: Sports Medicine
